# Patient Record
Sex: Male | Born: 2000
Health system: Southern US, Community
[De-identification: ages and names within clinical notes are randomized; demographics above are authoritative.]

---
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
## 2019-06-30 ENCOUNTER — Other Ambulatory Visit: Payer: Self-pay

## 2019-06-30 ENCOUNTER — Ambulatory Visit: Payer: Medicaid Other | Attending: Internal Medicine

## 2019-06-30 DIAGNOSIS — Z20822 Contact with and (suspected) exposure to covid-19: Secondary | ICD-10-CM

## 2019-06-30 DIAGNOSIS — Z20828 Contact with and (suspected) exposure to other viral communicable diseases: Secondary | ICD-10-CM | POA: Diagnosis not present

## 2019-07-01 LAB — NOVEL CORONAVIRUS, NAA: SARS-CoV-2, NAA: NOT DETECTED

## 2019-10-06 DIAGNOSIS — Z23 Encounter for immunization: Secondary | ICD-10-CM | POA: Diagnosis not present

## 2019-10-29 DIAGNOSIS — Z23 Encounter for immunization: Secondary | ICD-10-CM | POA: Diagnosis not present

## 2020-06-18 ENCOUNTER — Encounter: Payer: Self-pay | Admitting: Family Medicine

## 2020-06-18 ENCOUNTER — Ambulatory Visit (INDEPENDENT_AMBULATORY_CARE_PROVIDER_SITE_OTHER): Payer: BC Managed Care – PPO | Admitting: Family Medicine

## 2020-06-18 ENCOUNTER — Other Ambulatory Visit: Payer: Self-pay

## 2020-06-18 VITALS — BP 132/74 | HR 88 | Temp 97.8°F | Wt 194.0 lb

## 2020-06-18 DIAGNOSIS — R942 Abnormal results of pulmonary function studies: Secondary | ICD-10-CM | POA: Insufficient documentation

## 2020-06-18 NOTE — Progress Notes (Signed)
Patient ID: Shaun Warren, male    DOB: February 23, 2001, 19 y.o.   MRN: [id number]   Chief Complaint  Patient presents with  . abnormal respiratory exam    Patient was seen at urgent care this morning for respiratory exam for work. Patient was told spirometry test was abnormal and to follow up with pcp. Patient not having any issues.   Subjective:  CC: abnormal PFT from urgent care for work  This is a new problem.  Presents today with abnormal PFT results from urgent care for his work.  Reports that he has no respiratory history, no shortness of breath, excellent exercise endurance.  He plays full court basketball every Friday with no problems, has a history of vaping, he quit 2 to 3 years ago.  Denies marijuana denies having Covid in the past.  Pertinent negatives include no fever, no chills, no chest pain, no shortness of breath.  He feels that perhaps he did not do the test correctly today.  Reports that he "passed "for his work.  Was told to follow-up with his PCP.    Medical History Griselda has no past medical history on file.   Outpatient Encounter Medications as of 06/18/2020  Medication Sig  . triamcinolone cream (KENALOG) 0.1 % Apply 1 application topically 2 (two) times daily. Prn rash; use up to 2 weeks (Patient not taking: Reported on 04/21/2018)  . [DISCONTINUED] doxycycline (VIBRA-TABS) 100 MG tablet Take 1 tablet (100 mg total) by mouth 2 (two) times daily.   No facility-administered encounter medications on file as of 06/18/2020.     Review of Systems  Constitutional: Negative for chills and fever.  Respiratory: Negative for shortness of breath.   Cardiovascular: Negative for chest pain.     Vitals BP 132/74   Pulse 88   Temp 97.8 F (36.6 C)   Wt 194 lb (88 kg)   SpO2 98%   BMI 27.84 kg/m   Objective:   Physical Exam Constitutional:      General: He is not in acute distress.    Appearance: Normal appearance.  Cardiovascular:     Rate and Rhythm: Normal  rate and regular rhythm.     Heart sounds: Normal heart sounds.  Pulmonary:     Effort: Pulmonary effort is normal.     Breath sounds: Normal breath sounds.  Skin:    General: Skin is warm and dry.  Neurological:     Mental Status: He is alert and oriented to person, place, and time.  Psychiatric:        Mood and Affect: Mood normal.        Behavior: Behavior normal.      Assessment and Plan   1. Abnormal PFT   Offered to refer to pulmonology. Has never had any issues with respiratory status and feels that he did not perform the test correctly. Excellent exercise endurance. Declines want a pulm referral at this time.  He understands that if his symptoms every change to let us know. At this time I would not see any reason to initiate treatment.   Agrees with plan of care discussed today. Understands warning signs to seek further care: Chest pain, shortness of breath, any change in his exercise endurance. Understands to follow-up if ever have a any respiratory symptoms.  Will refer to pulmonology at that time.  No indication for treatment, declines referral at this time.  Consulted with Dr. Lilyan Punt during patient visit, agrees no indication for treatment at  this time.   Dorena Bodo, FNP-C

## 2020-06-18 NOTE — Patient Instructions (Signed)

## 2020-08-16 ENCOUNTER — Ambulatory Visit: Payer: BC Managed Care – PPO | Admitting: Family Medicine

## 2020-08-16 ENCOUNTER — Encounter: Payer: Self-pay | Admitting: Family Medicine

## 2020-09-10 ENCOUNTER — Encounter: Payer: BC Managed Care – PPO | Admitting: Family Medicine

## 2020-09-14 ENCOUNTER — Encounter: Payer: BC Managed Care – PPO | Admitting: Family Medicine

## 2020-09-17 ENCOUNTER — Other Ambulatory Visit: Payer: Self-pay

## 2020-09-17 ENCOUNTER — Ambulatory Visit (INDEPENDENT_AMBULATORY_CARE_PROVIDER_SITE_OTHER): Payer: BC Managed Care – PPO | Admitting: Family Medicine

## 2020-09-17 ENCOUNTER — Encounter: Payer: Self-pay | Admitting: Family Medicine

## 2020-09-17 VITALS — BP 124/77 | HR 92 | Temp 95.1°F | Ht 69.0 in | Wt 186.4 lb

## 2020-09-17 DIAGNOSIS — Z13220 Encounter for screening for lipoid disorders: Secondary | ICD-10-CM | POA: Diagnosis not present

## 2020-09-17 DIAGNOSIS — Z0000 Encounter for general adult medical examination without abnormal findings: Secondary | ICD-10-CM

## 2020-09-17 DIAGNOSIS — R5383 Other fatigue: Secondary | ICD-10-CM | POA: Diagnosis not present

## 2020-09-17 NOTE — Progress Notes (Signed)
The patient comes in today for a wellness visit.    A review of their health history was completed.  A review of medications was also completed.  Any needed refills; none  Eating habits: tries to be healthy  Falls/  MVA accidents in past few months: none  Regular exercise: exercises on a regular basis   Specialist pt sees on regular basis: none  Preventative health issues were discussed.   Additional concerns: none    Patient ID: Shaun Warren, male    DOB: Aug 24, 2000, 19 y.o.   MRN: [id number]   Chief Complaint  Patient presents with  . Annual Exam   Subjective:  CC: annual wellness  Presents today for annual wellness.  Denies any health concerns today, reports he is sexually active, with one male partner.  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  Denies fever, chills, chest pain, shortness of breath.    Medical History Shaun Warren has no past medical history on file.   Outpatient Encounter Medications as of 09/17/2020  Medication Sig  . [DISCONTINUED] triamcinolone cream (KENALOG) 0.1 % Apply 1 application topically 2 (two) times daily. Prn rash; use up to 2 weeks (Patient not taking: Reported on 04/21/2018)   No facility-administered encounter medications on file as of 09/17/2020.     Review of Systems  Constitutional: Positive for fatigue. Negative for chills and fever.       Works night shift- mostly sleeps okay.   Respiratory: Negative for chest tightness and shortness of breath.   Cardiovascular: Negative for chest pain, palpitations and leg swelling.  Gastrointestinal: Negative for abdominal pain.  Musculoskeletal: Negative for joint swelling and myalgias.  Skin: Negative for rash.  Neurological: Negative for headaches.     Vitals BP 124/77   Pulse 92   Temp (!) 95.1 F (35.1 C)   Ht 5\' 9"  (1.753 m)   Wt 186 lb 6.4 oz (84.6 kg)   SpO2 100%   BMI 27.53 kg/m   Objective:   Physical Exam Vitals  reviewed.  HENT:     Right Ear: Tympanic membrane normal.     Left Ear: Tympanic membrane normal.     Nose: Nose normal.     Mouth/Throat:     Mouth: Mucous membranes are moist.     Pharynx: Oropharynx is clear.  Eyes:     Extraocular Movements: Extraocular movements intact.     Pupils: Pupils are equal, round, and reactive to light.  Cardiovascular:     Rate and Rhythm: Normal rate and regular rhythm.     Heart sounds: Normal heart sounds.  Pulmonary:     Effort: Pulmonary effort is normal.     Breath sounds: Normal breath sounds.  Abdominal:     General: Bowel sounds are normal.     Tenderness: There is no abdominal tenderness.  Musculoskeletal:        General: Normal range of motion.     Cervical back: Normal range of motion.  Skin:    General: Skin is warm and dry.  Neurological:     General: No focal deficit present.     Mental Status: He is alert.  Psychiatric:        Behavior: Behavior normal.      Assessment and Plan   1. Wellness examination  2. Screening for cholesterol level - Lipid Profile  3. Fatigue, unspecified type - CBC with Differential    Impression: Normal physical exam without  abnormal findings.  Will get CBC and lipid profile today.   Safety measures appropriate for age discussed: wears seatbelt 1-00% of time.  Immunizations reviewed: Covid vaccine 2 doses.  Encouraged booster. Diet and exercise/ lifestyle modifications discussed: eats fruts and vegetables. Exercises regularly. Recommend 150 minutes per week of exercise such as walking. Recommend lots of fresh produce to include fruits, vegetables, beans, healthy fats such as avocado, nuts, seeds, and 3-6 ounces of protein at each meal.  Avoid fried foods and fast food. Limit alcohol consumption: no more than one drink per day for women and 2 drinks per day for men. Avoid getting drunk.  Stress management discussed: exercises.  Routine vision and dental screening discussed: recommend dentist  every 6 months, gets vision checked every 2 years.  Health maintenance: declines hep b and c today. Not sure if get covid booster.  Questions answered.   Agrees with plan of care discussed today. Understands warning signs to seek further care: chest pain, shortness of breath, any significant change in health.  Understands to follow-up in 1 year for wellness, sooner if needed.  Will notify once lab results are available.  Declines STD testing, 1 sexual partner, no risky behavior.    Shaun Olive, NP 09/17/2020

## 2020-09-17 NOTE — Patient Instructions (Signed)
Preventive Care 21-21 Years Old, Male Preventive care refers to lifestyle choices and visits with your health care provider that can promote health and wellness. This includes:  A yearly physical exam. This is also called an annual wellness visit.  Regular dental and eye exams.  Immunizations.  Screening for certain conditions.  Healthy lifestyle choices, such as: ? Eating a healthy diet. ? Getting regular exercise. ? Not using drugs or products that contain nicotine and tobacco. ? Limiting alcohol use. What can I expect for my preventive care visit? Physical exam Your health care provider may check your:  Height and weight. These may be used to calculate your BMI (body mass index). BMI is a measurement that tells if you are at a healthy weight.  Heart rate and blood pressure.  Body temperature.  Skin for abnormal spots. Counseling Your health care provider may ask you questions about your:  Past medical problems.  Family's medical history.  Alcohol, tobacco, and drug use.  Emotional well-being.  Home life and relationship well-being.  Sexual activity.  Diet, exercise, and sleep habits.  Work and work environment.  Access to firearms. What immunizations do I need? Vaccines are usually given at various ages, according to a schedule. Your health care provider will recommend vaccines for you based on your age, medical history, and lifestyle or other factors, such as travel or where you work.   What tests do I need? Blood tests  Lipid and cholesterol levels. These may be checked every 5 years starting at age 20.  Hepatitis C test.  Hepatitis B test. Screening  Diabetes screening. This is done by checking your blood sugar (glucose) after you have not eaten for a while (fasting).  Genital exam to check for testicular cancer or hernias.  STD (sexually transmitted disease) testing, if you are at risk. Talk with your health care provider about your test results,  treatment options, and if necessary, the need for more tests.   Follow these instructions at home: Eating and drinking  Eat a healthy diet that includes fresh fruits and vegetables, whole grains, lean protein, and low-fat dairy products.  Drink enough fluid to keep your urine pale yellow.  Take vitamin and mineral supplements as recommended by your health care provider.  Do not drink alcohol if your health care provider tells you not to drink.  If you drink alcohol: ? Limit how much you have to 0-2 drinks a day. ? Be aware of how much alcohol is in your drink. In the [place] one drink equals one 12 oz bottle of beer (355 mL), one 5 oz glass of wine (148 mL), or one 1 oz glass of hard liquor (44 mL).   Lifestyle  Take daily care of your teeth and gums. Brush your teeth every morning and night with fluoride toothpaste. Floss one time each day.  Stay active. Exercise for at least 30 minutes 5 or more days each week.  Do not use any products that contain nicotine or tobacco, such as cigarettes, e-cigarettes, and chewing tobacco. If you need help quitting, ask your health care provider.  Do not use drugs.  If you are sexually active, practice safe sex. Use a condom or other form of protection to prevent STIs (sexually transmitted infections).  Find healthy ways to cope with stress, such as: ? Meditation, yoga, or listening to music. ? Journaling. ? Talking to a trusted person. ? Spending time with friends and family. Safety  Always wear your seat belt while driving   or riding in a vehicle.  Do not drive: ? If you have been drinking alcohol. Do not ride with someone who has been drinking. ? When you are tired or distracted. ? While texting.  Wear a helmet and other protective equipment during sports activities.  If you have firearms in your house, make sure you follow all gun safety procedures.  Seek help if you have been physically or sexually abused. What's next?  Go to your  health care provider once a year for an annual wellness visit.  Ask your health care provider how often you should have your eyes and teeth checked.  Stay up to date on all vaccines. This information is not intended to replace advice given to you by your health care provider. Make sure you discuss any questions you have with your health care provider. Document Revised: 03/16/2019 Document Reviewed: 06/24/2018 Elsevier Patient Education  2021 Elsevier Inc.  

## 2020-09-18 LAB — CBC WITH DIFFERENTIAL/PLATELET
Basophils Absolute: 0.1 10*3/uL (ref 0.0–0.2)
Basos: 1 %
EOS (ABSOLUTE): 0.5 10*3/uL — ABNORMAL HIGH (ref 0.0–0.4)
Eos: 8 %
Hematocrit: 48.4 % (ref 37.5–51.0)
Hemoglobin: 16.6 g/dL (ref 13.0–17.7)
Immature Grans (Abs): 0 10*3/uL (ref 0.0–0.1)
Immature Granulocytes: 0 %
Lymphocytes Absolute: 1.6 10*3/uL (ref 0.7–3.1)
Lymphs: 25 %
MCH: 30.9 pg (ref 26.6–33.0)
MCHC: 34.3 g/dL (ref 31.5–35.7)
MCV: 90 fL (ref 79–97)
Monocytes Absolute: 0.5 10*3/uL (ref 0.1–0.9)
Monocytes: 8 %
Neutrophils Absolute: 3.8 10*3/uL (ref 1.4–7.0)
Neutrophils: 58 %
Platelets: 268 10*3/uL (ref 150–450)
RBC: 5.37 x10E6/uL (ref 4.14–5.80)
RDW: 13 % (ref 11.6–15.4)
WBC: 6.5 10*3/uL (ref 3.4–10.8)

## 2020-09-18 LAB — LIPID PANEL
Chol/HDL Ratio: 3.9 ratio (ref 0.0–5.0)
Cholesterol, Total: 176 mg/dL (ref 100–199)
HDL: 45 mg/dL (ref >39)
LDL Chol Calc (NIH): 118 mg/dL — ABNORMAL HIGH (ref 0–99)
Triglycerides: 71 mg/dL (ref 0–149)
VLDL Cholesterol Cal: 13 mg/dL (ref 5–40)

## 2021-01-25 DIAGNOSIS — X12XXXA Contact with other hot fluids, initial encounter: Secondary | ICD-10-CM | POA: Diagnosis not present

## 2021-01-25 DIAGNOSIS — T2200XA Burn of unspecified degree of shoulder and upper limb, except wrist and hand, unspecified site, initial encounter: Secondary | ICD-10-CM | POA: Diagnosis not present

## 2021-01-25 DIAGNOSIS — T22012A Burn of unspecified degree of left forearm, initial encounter: Secondary | ICD-10-CM | POA: Diagnosis not present

## 2021-01-25 DIAGNOSIS — T2020XA Burn of second degree of head, face, and neck, unspecified site, initial encounter: Secondary | ICD-10-CM | POA: Diagnosis not present

## 2021-01-25 DIAGNOSIS — T22212A Burn of second degree of left forearm, initial encounter: Secondary | ICD-10-CM | POA: Diagnosis not present

## 2021-01-28 ENCOUNTER — Telehealth: Payer: Self-pay

## 2021-01-28 NOTE — Telephone Encounter (Signed)
Transition Care Management Unsuccessful Follow-up Telephone Call  Date of discharge and from where:  01/25/2021-[facility]  Attempts:  1st Attempt  Reason for unsuccessful TCM follow-up call:  Left voice message

## 2021-01-29 NOTE — Telephone Encounter (Signed)
Transition Care Management Unsuccessful Follow-up Telephone Call  Date of discharge and from where:  01/25/2021-[facility]  Attempts:  2nd Attempt  Reason for unsuccessful TCM follow-up call:  Left voice message    

## 2021-01-30 ENCOUNTER — Other Ambulatory Visit: Payer: Self-pay

## 2021-01-30 ENCOUNTER — Encounter: Payer: Self-pay | Admitting: Emergency Medicine

## 2021-01-30 ENCOUNTER — Ambulatory Visit
Admission: EM | Admit: 2021-01-30 | Discharge: 2021-01-30 | Disposition: A | Discharge status: Home / Self Care | Payer: BC Managed Care – PPO | Attending: Family Medicine | Admitting: Family Medicine

## 2021-01-30 DIAGNOSIS — T22219A Burn of second degree of unspecified forearm, initial encounter: Secondary | ICD-10-CM | POA: Diagnosis not present

## 2021-01-30 MED ORDER — SILVER SULFADIAZINE 1 % EX CREA: TOPICAL_CREAM | Freq: Once | CUTANEOUS | Status: AC

## 2021-01-30 MED ORDER — SILVER SULFADIAZINE 1 % EX CREA
TOPICAL_CREAM | CUTANEOUS | 1 refills | Status: DC
Start: 2021-01-30 — End: 2024-02-08
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
## 2021-01-30 NOTE — Telephone Encounter (Signed)
Transition Care Management Unsuccessful Follow-up Telephone Call  Date of discharge and from where:  01/25/2021-[facility]  Attempts:  3rd Attempt  Reason for unsuccessful TCM follow-up call:  Left voice message    
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
## 2021-08-22 ENCOUNTER — Other Ambulatory Visit: Payer: Self-pay

## 2021-08-22 ENCOUNTER — Ambulatory Visit (INDEPENDENT_AMBULATORY_CARE_PROVIDER_SITE_OTHER): Payer: BC Managed Care – PPO

## 2021-08-22 ENCOUNTER — Ambulatory Visit
Admission: EM | Admit: 2021-08-22 | Discharge: 2021-08-22 | Disposition: A | Discharge status: Home / Self Care | Payer: BC Managed Care – PPO | Attending: Urgent Care | Admitting: Urgent Care

## 2021-08-22 DIAGNOSIS — M79641 Pain in right hand: Secondary | ICD-10-CM

## 2021-08-22 DIAGNOSIS — S60221A Contusion of right hand, initial encounter: Secondary | ICD-10-CM | POA: Diagnosis not present

## 2021-08-22 DIAGNOSIS — R2231 Localized swelling, mass and lump, right upper limb: Secondary | ICD-10-CM

## 2021-08-22 MED ORDER — NAPROXEN 500 MG PO TABS: 500.0000 mg | ORAL_TABLET | Freq: Two times a day (BID) | ORAL | 0 refills | Status: DC

## 2021-08-22 NOTE — ED Provider Notes (Signed)
[facility]   MRN: [id number] DOB: February 05, 2001  Subjective:   Shaun Warren is a 21 y.o. male presenting for 4-day history of acute onset persistent and worsening right hand pain with swelling.  Symptoms started from an injury while he was playing basketball.  Patient states that another player made impact with his elbow against the hand.  He does have a history of an injury, deep laceration to the right fifth MCP but this was a while back.  No history of fracture.  No current facility-administered medications for this encounter.  Current Outpatient Medications:    silver sulfADIAZINE (SILVADENE) 1 % cream, Apply to burn wound twice daily with dressing change., Disp: 400 g, Rfl: 1   Allergies  Allergen Reactions   Amoxicillin-Pot Clavulanate     diarrhea Other reaction(s): diarrhea    History reviewed. No pertinent past medical history.   History reviewed. No pertinent surgical history.  History reviewed. No pertinent family history.  Social History   Tobacco Use   Smoking status: Never   Smokeless tobacco: Never  Substance Use Topics   Alcohol use: Yes   Drug use: Never    ROS   Objective:   Vitals: BP (!) 151/79 (BP Location: Right Arm)    Pulse 99    Temp 98.2 F (36.8 C) (Oral)    Resp 16    SpO2 98%   Physical Exam Constitutional:      General: He is not in acute distress.    Appearance: Normal appearance. He is well-developed and normal weight. He is not ill-appearing, toxic-appearing or diaphoretic.  HENT:     Head: Normocephalic and atraumatic.     Right Ear: External ear normal.     Left Ear: External ear normal.     Nose: Nose normal.     Mouth/Throat:     Pharynx: Oropharynx is clear.  Eyes:     General: No scleral icterus.       Right eye: No discharge.        Left eye: No discharge.     Extraocular Movements: Extraocular movements intact.  Cardiovascular:     Rate and Rhythm: Normal rate.  Pulmonary:     Effort:  Pulmonary effort is normal.  Musculoskeletal:       Hands:     Cervical back: Normal range of motion.  Neurological:     Mental Status: He is alert and oriented to person, place, and time.  Psychiatric:        Mood and Affect: Mood normal.        Behavior: Behavior normal.        Thought Content: Thought content normal.        Judgment: Judgment normal.   DG Hand Complete Right  Result Date: 08/22/2021 CLINICAL DATA:  Pain and swelling. Injury 4 days ago playing basketball. Fifth metacarpophalangeal joint pain traveling down the fifth metacarpal. EXAM: RIGHT HAND - COMPLETE 3+ VIEW COMPARISON:  None. FINDINGS: Normal bone mineralization. Joint spaces are preserved. No acute fracture is seen. No dislocation. IMPRESSION: Normal right hand radiographs. Electronically Signed   By: Neita Garnet M.D.   On: 08/22/2021 10:32     Assessment and Plan :   PDMP not reviewed this encounter.  1. Contusion of right hand, initial encounter   2. Right hand pain   3. Localized swelling on right hand    Recommended naproxen for pain and inflammation in the context of having a right hand contusion. Counseled  patient on potential for adverse effects with medications prescribed/recommended today, ER and return-to-clinic precautions discussed, patient verbalized understanding.    Wallis Bamberg, PA-C 08/22/21 1036

## 2021-08-22 NOTE — ED Triage Notes (Signed)
Pt reports swelling and pian in right hand 4 days. States he was playing basketball when someone hit his hand with the elbow.

## 2022-11-11 ENCOUNTER — Other Ambulatory Visit: Payer: Self-pay

## 2022-11-11 ENCOUNTER — Ambulatory Visit
Admission: RE | Admit: 2022-11-11 | Discharge: 2022-11-11 | Disposition: A | Discharge status: Home / Self Care | Payer: BC Managed Care – PPO | Source: Ambulatory Visit | Attending: Family Medicine | Admitting: Family Medicine

## 2022-11-11 VITALS — BP 123/77 | HR 75 | Temp 98.8°F | Resp 20

## 2022-11-11 DIAGNOSIS — R238 Other skin changes: Secondary | ICD-10-CM

## 2022-11-11 DIAGNOSIS — L299 Pruritus, unspecified: Secondary | ICD-10-CM

## 2022-11-11 MED ORDER — DOXYCYCLINE HYCLATE 100 MG PO CAPS: 100.0000 mg | ORAL_CAPSULE | Freq: Two times a day (BID) | ORAL | 0 refills | Status: DC

## 2022-11-11 MED ORDER — PREDNISONE 20 MG PO TABS: 40.0000 mg | ORAL_TABLET | Freq: Every day | ORAL | 0 refills | Status: DC

## 2022-11-11 NOTE — ED Triage Notes (Signed)
Pt reports possible insect bite to top of right foot since Friday. Pt reports site has progressively gotten bigger.has tried triple antibiotic ointment with no change in symptoms.

## 2022-11-12 NOTE — ED Provider Notes (Signed)
  [facility]   [id number] 11/11/22 Arrival Time: 1451  ASSESSMENT & PLAN:  1. Skin irritation   2. Itch of skin    Possible insect bite. Will cover for infection but suspect mostly allergic response. Begin: Meds ordered this encounter  Medications   predniSONE (DELTASONE) 20 MG tablet    Sig: Take 2 tablets (40 mg total) by mouth daily.    Dispense:  10 tablet    Refill:  0   doxycycline (VIBRAMYCIN) 100 MG capsule    Sig: Take 1 capsule (100 mg total) by mouth 2 (two) times daily.    Dispense:  14 capsule    Refill:  0    Will follow up with PCP or here if worsening or failing to improve as anticipated. Reviewed expectations re: course of current medical issues. Questions answered. Outlined signs and symptoms indicating need for more acute intervention. Patient verbalized understanding. After Visit Summary given.   SUBJECTIVE:  Shaun Warren is a 22 y.o. male who presents with a skin complaint. Pt reports possible insect bite to top of right foot a few d ago. Pt reports site has progressively gotten bigger.has tried triple antibiotic ointment with no change in symptoms. Denies fever. Very itchy.    OBJECTIVE: Vitals:   11/11/22 1503  BP: 123/77  Pulse: 75  Resp: 20  Temp: 98.8 F (37.1 C)  TempSrc: Oral  SpO2: 98%    General appearance: alert; no distress HEENT: [place]; AT Neck: supple with FROM Lungs: clear to auscultation bilaterally Heart: regular rate and rhythm Extremities: no edema; moves all extremities normally Skin: warm and dry; approx 3 x 3 cm area of raised erythema over dorsal R foot; very inflamed; warm to touch; irreg borders Psychological: alert and cooperative; normal mood and affect  Allergies  Allergen Reactions   Amoxicillin-Pot Clavulanate     diarrhea Other reaction(s): diarrhea    History reviewed. No pertinent past medical history. Social History   Socioeconomic History   Marital status: Single    Spouse name:  Not on file   Number of children: Not on file   Years of education: Not on file   Highest education level: Not on file  Occupational History   Not on file  Tobacco Use   Smoking status: Never   Smokeless tobacco: Never  Substance and Sexual Activity   Alcohol use: Yes   Drug use: Never   Sexual activity: Yes  Other Topics Concern   Not on file  Social History Narrative   Not on file   Social Determinants of Health   Financial Resource Strain: Not on file  Food Insecurity: Not on file  Transportation Needs: Not on file  Physical Activity: Not on file  Stress: Not on file  Social Connections: Not on file  Intimate Partner Violence: Not on file   History reviewed. No pertinent family history. History reviewed. No pertinent surgical history.    Mardella Layman, MD 11/12/22 [phone]

## 2023-05-07 IMAGING — DX DG HAND COMPLETE 3+V*R*
4 series · 4 of 4 positions shown · non-contrast
Comparison: None.

CLINICAL DATA: Pain and swelling. Injury 4 days ago playing
basketball. Fifth metacarpophalangeal joint pain traveling down the
fifth metacarpal.

EXAM:
RIGHT HAND - COMPLETE 3+ VIEW

[hand pa]
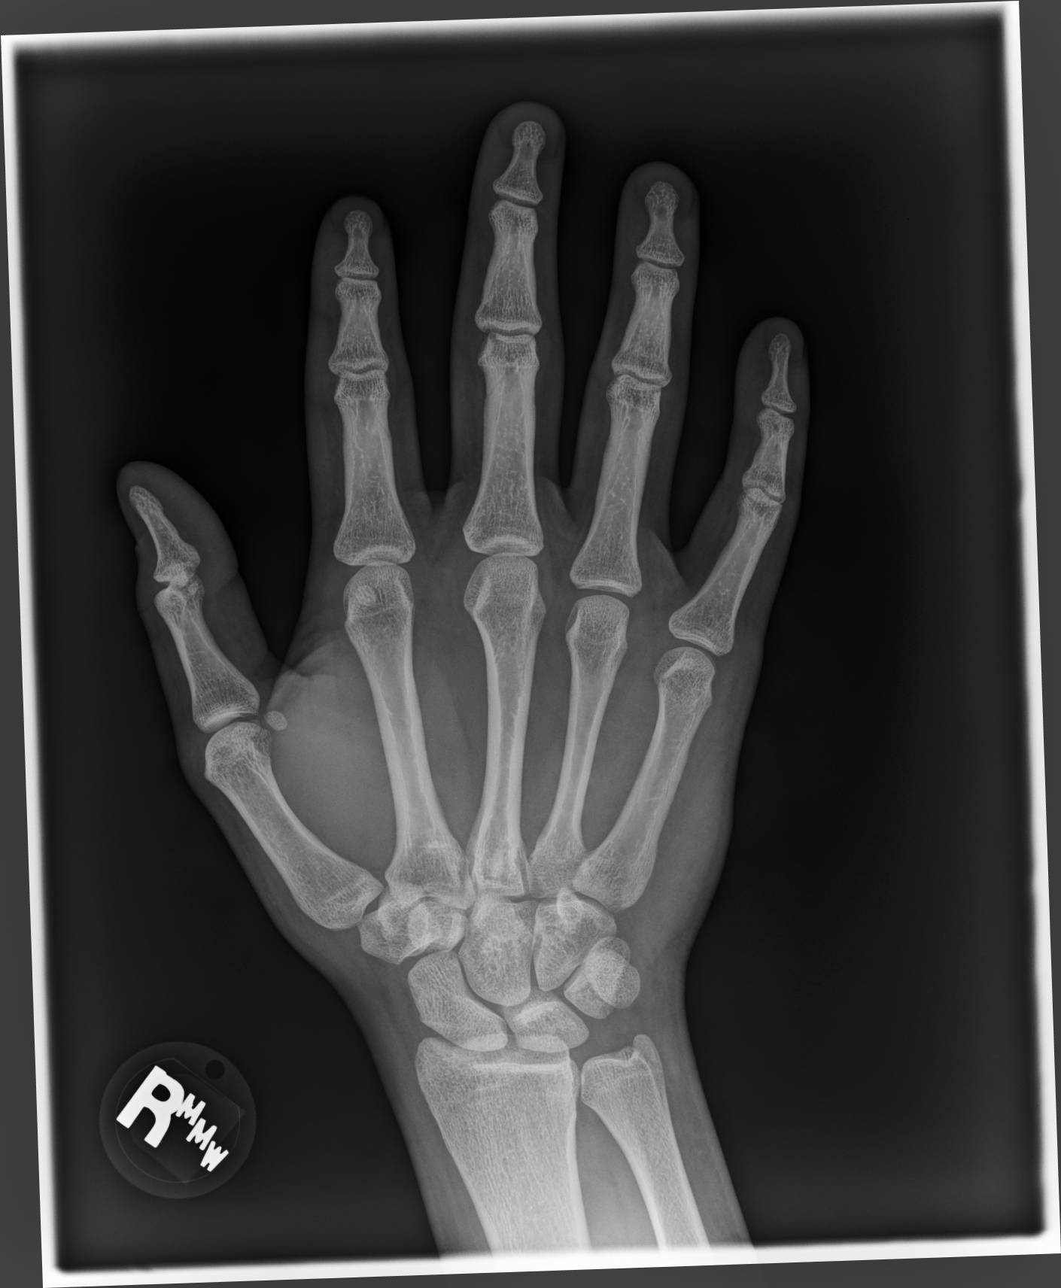

[hand mlo]
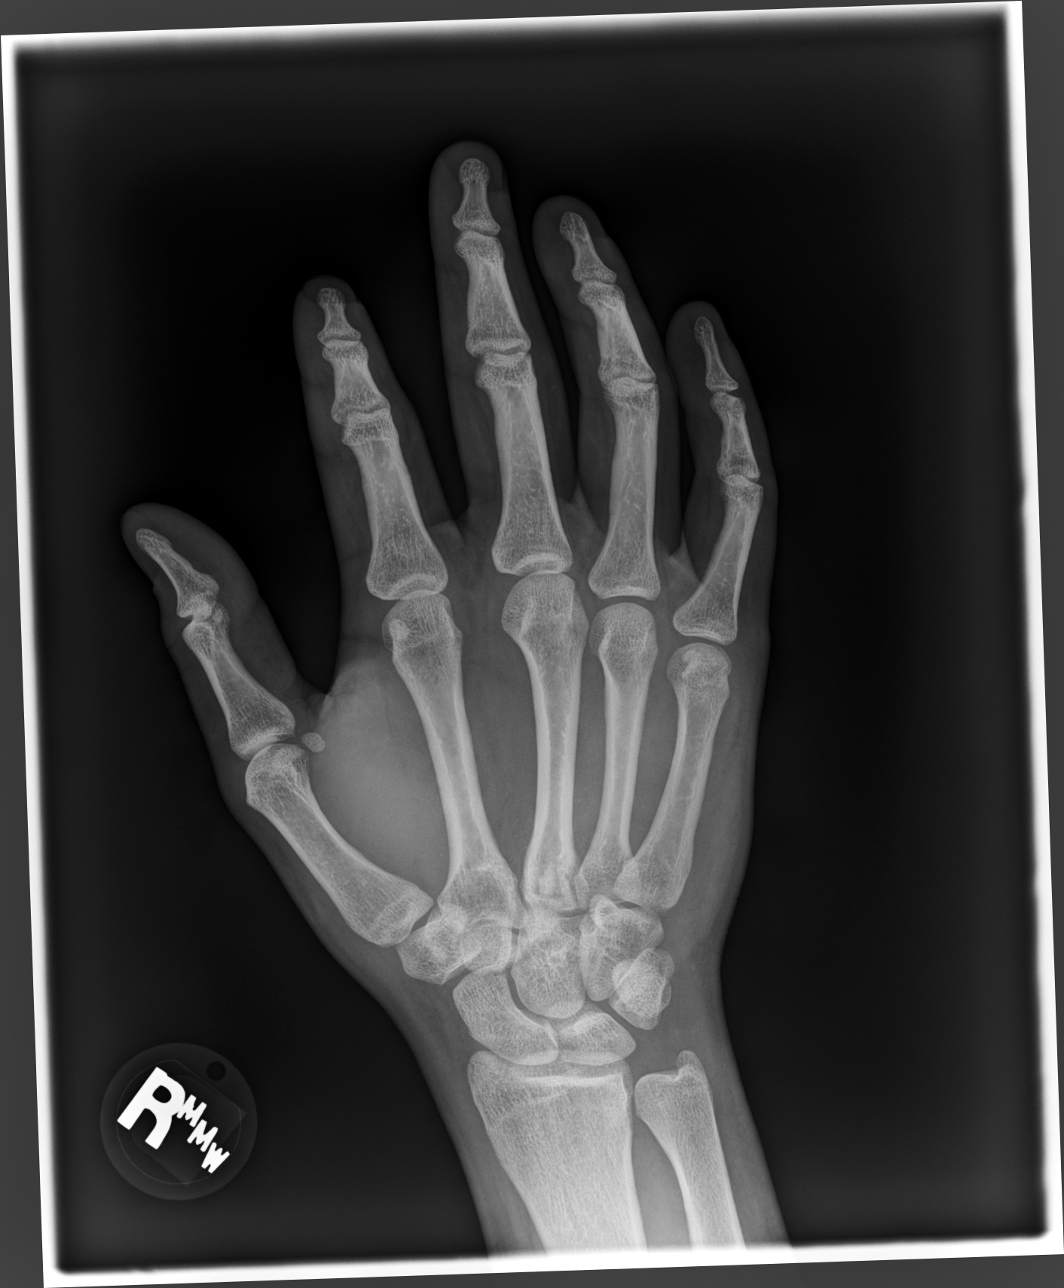

[hand lat (1 of 2)]
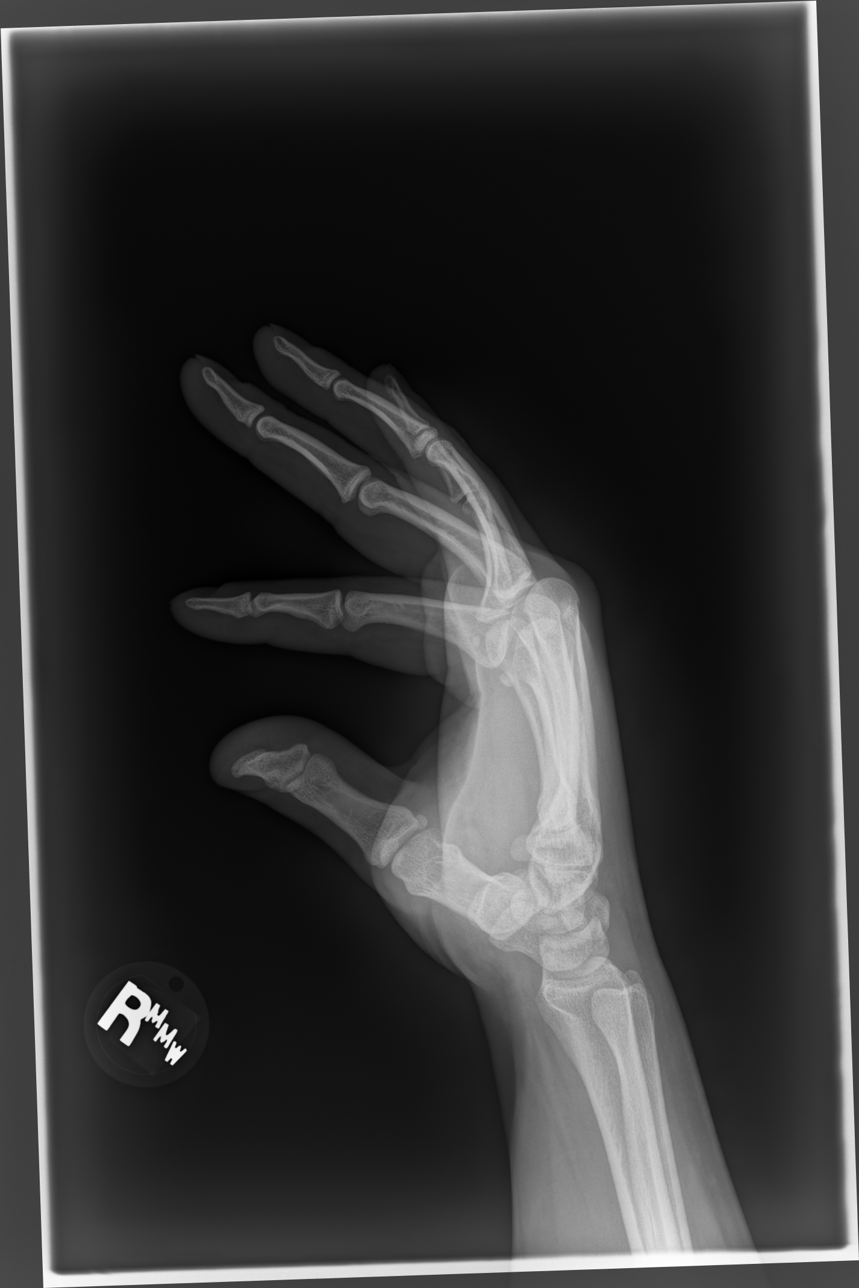

[hand lat (2 of 2)]
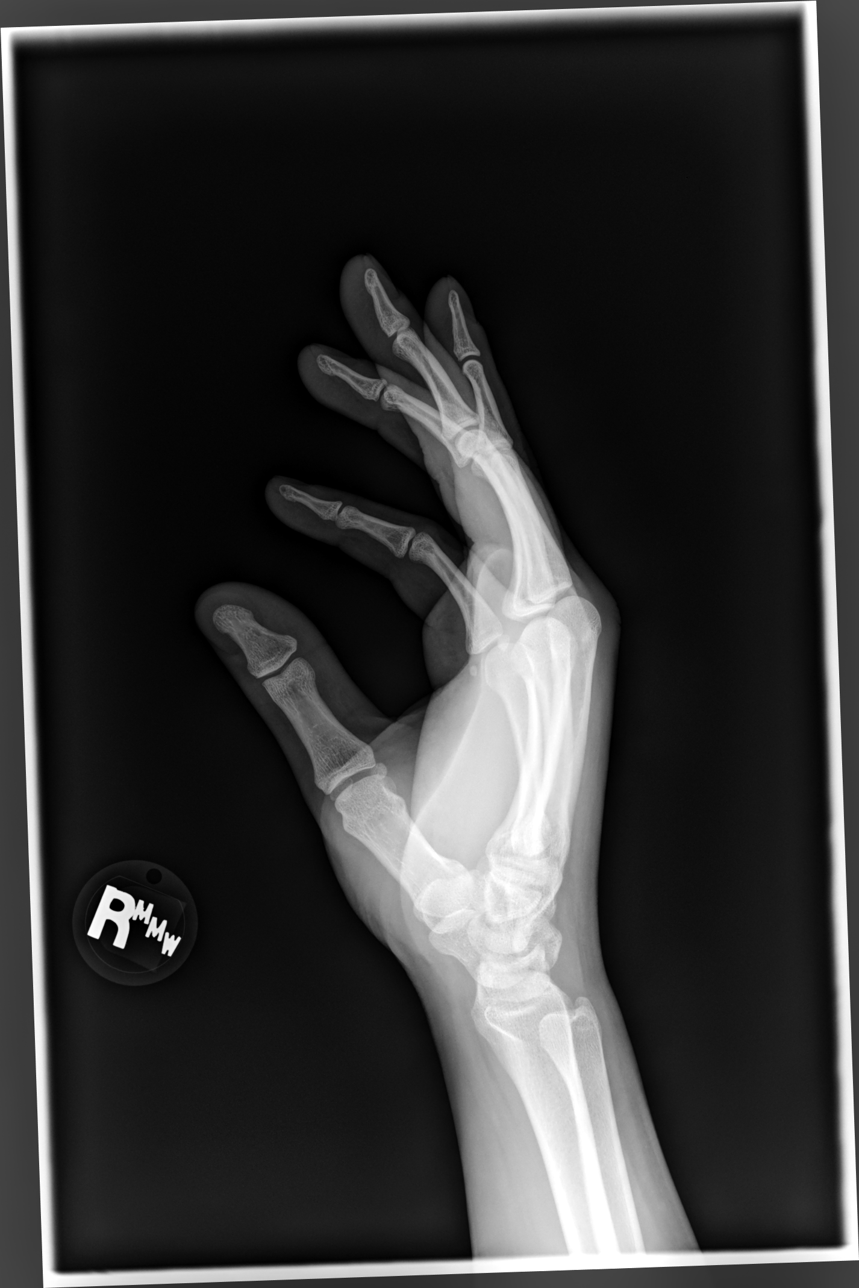

[4 of 4 positions shown; findings below may reference images not displayed]

FINDINGS: Normal bone mineralization. Joint spaces are preserved. No acute
fracture is seen. No dislocation.
IMPRESSION: Normal right hand radiographs.

## 2023-09-01 DIAGNOSIS — J302 Other seasonal allergic rhinitis: Secondary | ICD-10-CM | POA: Insufficient documentation

## 2023-12-14 DIAGNOSIS — N50812 Left testicular pain: Secondary | ICD-10-CM | POA: Diagnosis not present

## 2023-12-14 DIAGNOSIS — N451 Epididymitis: Secondary | ICD-10-CM | POA: Diagnosis not present

## 2024-02-08 ENCOUNTER — Encounter: Payer: Self-pay | Admitting: Physician Assistant

## 2024-02-08 ENCOUNTER — Ambulatory Visit: Admitting: Physician Assistant

## 2024-02-08 VITALS — BP 125/75 | Ht 69.0 in | Wt 167.0 lb

## 2024-02-08 DIAGNOSIS — Z0000 Encounter for general adult medical examination without abnormal findings: Secondary | ICD-10-CM

## 2024-02-08 DIAGNOSIS — Z0001 Encounter for general adult medical examination with abnormal findings: Secondary | ICD-10-CM

## 2024-02-08 DIAGNOSIS — Z7689 Persons encountering health services in other specified circumstances: Secondary | ICD-10-CM

## 2024-02-08 DIAGNOSIS — Z23 Encounter for immunization: Secondary | ICD-10-CM | POA: Diagnosis not present

## 2024-02-08 DIAGNOSIS — Z13220 Encounter for screening for lipoid disorders: Secondary | ICD-10-CM
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
## 2024-02-12 DIAGNOSIS — Z13220 Encounter for screening for lipoid disorders: Secondary | ICD-10-CM | POA: Diagnosis not present

## 2024-02-12 DIAGNOSIS — Z0000 Encounter for general adult medical examination without abnormal findings: Secondary | ICD-10-CM | POA: Diagnosis not present

## 2024-02-13 ENCOUNTER — Ambulatory Visit: Payer: Self-pay | Admitting: Physician Assistant

## 2024-02-13 LAB — CBC WITH DIFFERENTIAL/PLATELET
Basophils Absolute: 0.1 x10E3/uL (ref 0.0–0.2)
Basos: 1 %
EOS (ABSOLUTE): 0.2 x10E3/uL (ref 0.0–0.4)
Eos: 5 %
Hematocrit: 48.1 % (ref 37.5–51.0)
Hemoglobin: 16 g/dL (ref 13.0–17.7)
Immature Grans (Abs): 0 x10E3/uL (ref 0.0–0.1)
Immature Granulocytes: 0 %
Lymphocytes Absolute: 1.7 x10E3/uL (ref 0.7–3.1)
Lymphs: 32 %
MCH: 31.3 pg (ref 26.6–33.0)
MCHC: 33.3 g/dL (ref 31.5–35.7)
MCV: 94 fL (ref 79–97)
Monocytes Absolute: 0.5 x10E3/uL (ref 0.1–0.9)
Monocytes: 10 %
Neutrophils Absolute: 2.7 x10E3/uL (ref 1.4–7.0)
Neutrophils: 52 %
Platelets: 221 x10E3/uL (ref 150–450)
RBC: 5.12 x10E6/uL (ref 4.14–5.80)
RDW: 12.8 % (ref 11.6–15.4)
WBC: 5.2 x10E3/uL (ref 3.4–10.8)

## 2024-02-13 LAB — CMP14+EGFR
ALT: 18 IU/L (ref 0–44)
AST: 18 IU/L (ref 0–40)
Albumin: 4.8 g/dL (ref 4.3–5.2)
Alkaline Phosphatase: 59 IU/L (ref 44–121)
BUN/Creatinine Ratio: 13 (ref 9–20)
BUN: 13 mg/dL (ref 6–20)
Bilirubin Total: 0.4 mg/dL (ref 0.0–1.2)
CO2: 22 mmol/L (ref 20–29)
Calcium: 9.7 mg/dL (ref 8.7–10.2)
Chloride: 103 mmol/L (ref 96–106)
Creatinine, Ser: 1.01 mg/dL (ref 0.76–1.27)
Globulin, Total: 2.1 g/dL (ref 1.5–4.5)
Glucose: 94 mg/dL (ref 70–99)
Potassium: 4.9 mmol/L (ref 3.5–5.2)
Sodium: 139 mmol/L (ref 134–144)
Total Protein: 6.9 g/dL (ref 6.0–8.5)
eGFR: 107 mL/min/1.73 (ref >59)

## 2024-02-13 LAB — LIPID PANEL
Chol/HDL Ratio: 2.9 ratio (ref 0.0–5.0)
Cholesterol, Total: 149 mg/dL (ref 100–199)
HDL: 51 mg/dL (ref >39)
LDL Chol Calc (NIH): 88 mg/dL (ref 0–99)
Triglycerides: 42 mg/dL (ref 0–149)
VLDL Cholesterol Cal: 10 mg/dL (ref 5–40)

## 2024-07-13 ENCOUNTER — Ambulatory Visit
